# Patient Record
Sex: Female | Born: 2005 | Race: White | Hispanic: No | Marital: Single | Smoking: Never smoker
Health system: Southern US, Community
[De-identification: ages and names within clinical notes are randomized; demographics above are authoritative.]

## PROBLEM LIST (undated history)

## (undated) DIAGNOSIS — E271 Primary adrenocortical insufficiency: Secondary | ICD-10-CM

## (undated) DIAGNOSIS — L309 Dermatitis, unspecified: Secondary | ICD-10-CM

---
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
## 2023-01-19 ENCOUNTER — Ambulatory Visit: Payer: Medicaid Other | Admitting: Allergy

## 2023-01-19 ENCOUNTER — Ambulatory Visit (INDEPENDENT_AMBULATORY_CARE_PROVIDER_SITE_OTHER): Payer: Worker's Compensation | Admitting: Orthopaedic Surgery

## 2023-01-19 ENCOUNTER — Encounter: Payer: Self-pay | Admitting: Orthopaedic Surgery

## 2023-01-19 DIAGNOSIS — S83014A Lateral dislocation of right patella, initial encounter: Secondary | ICD-10-CM

## 2023-01-19 NOTE — Progress Notes (Signed)
Office Visit Note   Patient: Erin Blankenship           Date of Birth: [birth year]           MRN: [id number] Visit Date: 01/19/2023              Requested by: Casper Harrison, NP [address], [address] [place],  [place] [address] PCP: Francee Piccolo I, NP   Assessment & Plan: Visit Diagnoses:  1. Lateral dislocation of patella, right, initial encounter     Plan: At this point we will transition to a PSO brace.  She can go back to work as long as she is wearing the brace.  I will place a referral for physical therapy for strengthening.  Recheck in 4 weeks.  We were unable to get in touch with Worker's Comp. therefore we could not provide her with the brace today.  We will need to keep her out of work until she can wear the brace to work.  Total face to face encounter time was greater than 25 minutes and over half of this time was spent in counseling and/or coordination of care.  Follow-Up Instructions: Return in about 4 weeks (around 02/16/2023).   Orders:  No orders of the defined types were placed in this encounter.  No orders of the defined types were placed in this encounter.     Procedures: No procedures performed   Clinical Data: No additional findings.   Subjective: Chief Complaint  Patient presents with   Right Knee - Follow-up    Patella dislocation    HPI Erin Blankenship returns today for follow-up for right patellar dislocation.  Worker's Comp. injury.  She is doing much better.  Review of Systems  Constitutional: Negative.   HENT: Negative.    Eyes: Negative.   Respiratory: Negative.    Cardiovascular: Negative.   Endocrine: Negative.   Musculoskeletal: Negative.   Neurological: Negative.   Hematological: Negative.   Psychiatric/Behavioral: Negative.    All other systems reviewed and are negative.    Objective: Vital Signs: There were no vitals taken for this visit.  Physical Exam Vitals and nursing note reviewed.  Constitutional:      Appearance: She is  well-developed.  HENT:     Head: Normocephalic and atraumatic.  Pulmonary:     Effort: Pulmonary effort is normal.  Abdominal:     Palpations: Abdomen is soft.  Musculoskeletal:     Cervical back: Neck supple.  Skin:    General: Skin is warm.     Capillary Refill: Capillary refill takes less than 2 seconds.  Neurological:     Mental Status: She is alert and oriented to person, place, and time.  Psychiatric:        Behavior: Behavior normal.        Thought Content: Thought content normal.        Judgment: Judgment normal.    Ortho Exam Exam of the right knee shows no joint effusion.  Patella tracking is normal.  She has no tenderness.  She has some slight limitation with flexion of the knee. Specialty Comments:  No specialty comments available.  Imaging: No results found.   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 No pertinent surgical history. Social History   Occupational History   Not on file  Tobacco Use   Smoking status: Never   Smokeless tobacco: Not on file  Substance and Sexual Activity   Alcohol use: No   Drug use: No   Sexual activity: Not on file

## 2023-01-22 ENCOUNTER — Ambulatory Visit: Payer: Worker's Compensation

## 2023-01-22 NOTE — Progress Notes (Signed)
Patient was fit with a right small PSO brace.

## 2023-01-25 ENCOUNTER — Telehealth: Payer: Self-pay | Admitting: Orthopaedic Surgery

## 2023-01-25 NOTE — Telephone Encounter (Signed)
Patient called and said she is having increasing burning  pain in her left knee. CB#[phone]

## 2023-01-26 NOTE — Telephone Encounter (Signed)
Recommend ice and NSAID and rest.  Make sure the brace is fitting properly.  Thanks.

## 2023-01-26 NOTE — Telephone Encounter (Signed)
Tried to call. No answer. No voicemail.

## 2023-01-27 NOTE — Telephone Encounter (Signed)
It's likely from the tissues healing.  Hopefully she has started PT.

## 2023-01-27 NOTE — Telephone Encounter (Signed)
Called again. Patient states that she has a burning sensation with any weight bearing. She was fitted with 2 different PSO braces and was given the brace that fit her the best. She states that she has this pain with or without the brace. She has tried ice, Ibuprofen, and Tylenol.

## 2023-01-28 ENCOUNTER — Other Ambulatory Visit: Payer: Self-pay

## 2023-01-28 DIAGNOSIS — S83014A Lateral dislocation of right patella, initial encounter: Secondary | ICD-10-CM

## 2023-01-28 NOTE — Telephone Encounter (Signed)
yes

## 2023-01-28 NOTE — Telephone Encounter (Signed)
Tried to call. Call wouldn't go through.

## 2023-01-28 NOTE — Telephone Encounter (Signed)
Spoke with patient. She has been out of work due to knee pain. Can she get a note keeping her out? She has not started PT yet. Morrison Old has sent request for PT to her case manager.

## 2023-01-29 NOTE — Telephone Encounter (Signed)
Contacted patient and left note up front for pick up.

## 2023-03-10 ENCOUNTER — Ambulatory Visit (INDEPENDENT_AMBULATORY_CARE_PROVIDER_SITE_OTHER): Payer: Worker's Compensation | Admitting: Orthopaedic Surgery

## 2023-03-10 ENCOUNTER — Encounter: Payer: Self-pay | Admitting: Orthopaedic Surgery

## 2023-03-10 DIAGNOSIS — S83014A Lateral dislocation of right patella, initial encounter: Secondary | ICD-10-CM

## 2023-03-10 NOTE — Progress Notes (Signed)
Office Visit Note   Patient: Erin Blankenship           Date of Birth: 11/07/2005           MRN: [id number] Visit Date: 03/10/2023              Requested by: Casper Harrison, NP [address] [place],  [place] [address] PCP: Francee Piccolo I, NP   Assessment & Plan: Visit Diagnoses:  1. Lateral dislocation of patella, right, initial encounter     Plan: Impression is 10 weeks status post right knee patella dislocation.  Patient is clinically improving however she still has a fair amount of quad atrophy.  I urged her to push herself a little bit more with strengthening.  Continue to wean out of her PSO per PT.  Out of work for another 4 weeks.  Follow-up in 4 weeks for recheck.  Call with concerns or questions.  Total face to face encounter time was greater than 25 minutes and over half of this time was spent in counseling and/or coordination of care.  Follow-Up Instructions: Return in about 4 weeks (around 04/07/2023).   Orders:  No orders of the defined types were placed in this encounter.  No orders of the defined types were placed in this encounter.     Procedures: No procedures performed   Clinical Data: No additional findings.   Subjective: Chief Complaint  Patient presents with   Right Knee - Pain    HPI patient is a pleasant 17 year old female who is here today for follow-up of her right knee work comp injury.  She is approximately 10 weeks out from right knee patella dislocation.  She has been compliant wearing a PSO brace and has been in physical therapy.  She feels as though the pain has improved but she is still having some grinding to the lateral patella when she is out of the brace.  She also notes weakness when she is out of the brace.  She tells me that PT is trying to wean her out of her PSO brace to strengthen her quad.  She is nervous doing this as she had 1 instance last week when her knee gave way and she fell to the ground.    Review of Systems   Constitutional: Negative.   HENT: Negative.    Eyes: Negative.   Respiratory: Negative.    Cardiovascular: Negative.   Endocrine: Negative.   Musculoskeletal: Negative.   Neurological: Negative.   Hematological: Negative.   Psychiatric/Behavioral: Negative.    All other systems reviewed and are negative.    Objective: Vital Signs: There were no vitals taken for this visit.  Physical Exam Vitals and nursing note reviewed.  Constitutional:      Appearance: She is well-developed.  HENT:     Head: Normocephalic and atraumatic.  Pulmonary:     Effort: Pulmonary effort is normal.  Abdominal:     Palpations: Abdomen is soft.  Musculoskeletal:     Cervical back: Neck supple.  Skin:    General: Skin is warm.     Capillary Refill: Capillary refill takes less than 2 seconds.  Neurological:     Mental Status: She is alert and oriented to person, place, and time.  Psychiatric:        Behavior: Behavior normal.        Thought Content: Thought content normal.        Judgment: Judgment normal.     Ortho Exam  right knee exam: Range of motion 0 to 100 degrees.  She has tenderness to the lateral patella facet.  She is neurovascularly intact distally.  Specialty Comments:  No specialty comments available.  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 No pertinent surgical history. Social History   Occupational History   Not on file  Tobacco Use   Smoking  status: Never   Smokeless tobacco: Not on file  Substance and Sexual Activity   Alcohol use: No   Drug use: No   Sexual activity: Not on file

## 2023-04-09 ENCOUNTER — Ambulatory Visit: Payer: Managed Care, Other (non HMO) | Admitting: Orthopaedic Surgery

## 2023-04-29 ENCOUNTER — Ambulatory Visit: Payer: Worker's Compensation | Admitting: Orthopaedic Surgery

## 2023-04-29 DIAGNOSIS — S83014A Lateral dislocation of right patella, initial encounter: Secondary | ICD-10-CM

## 2023-04-29 NOTE — Progress Notes (Signed)
 Office Visit Note   Patient: Erin Blankenship           Date of Birth: May 27, 2005           MRN: [id number] Visit Date: 04/29/2023              Requested by: Brinda Mardy FERNS, NP [address], [address] [place],  [place] [address] PCP: Brinda Mardy I, NP   Assessment & Plan: Visit Diagnoses:  1. Lateral dislocation of patella, right, initial encounter     Plan: Arno is now 2 months status post right patellar dislocation.  She is pretty much back to baseline.  Her rating is 0%.  She is released to work without restrictions.  Follow-up as needed.  Follow-Up Instructions: No follow-ups on file.   Orders:  No orders of the defined types were placed in this encounter.  No orders of the defined types were placed in this encounter.     Procedures: No procedures performed   Clinical Data: No additional findings.   Subjective: Chief Complaint  Patient presents with   Right Knee - Pain    HPI [name] returns today to follow-up for right patellar dislocation.  She reports some achiness and discomfort in cold weather.  She is using athletic tape.  No other complaints. Review of Systems  Constitutional: Negative.   HENT: Negative.    Eyes: Negative.   Respiratory: Negative.    Cardiovascular: Negative.   Endocrine: Negative.   Musculoskeletal: Negative.   Neurological: Negative.   Hematological: Negative.   Psychiatric/Behavioral: Negative.    All other systems reviewed and are negative.    Objective: Vital Signs: There were no vitals taken for this visit.  Physical Exam Vitals and nursing note reviewed.  Constitutional:      Appearance: She is well-developed.  HENT:     Head: Normocephalic and atraumatic.  Pulmonary:     Effort: Pulmonary effort is normal.  Abdominal:     Palpations: Abdomen is soft.  Musculoskeletal:     Cervical back: Neck supple.  Skin:    General: Skin is warm.     Capillary Refill: Capillary refill takes less than 2 seconds.  Neurological:      Mental Status: She is alert and oriented to person, place, and time.  Psychiatric:        Behavior: Behavior normal.        Thought Content: Thought content normal.        Judgment: Judgment normal.     Ortho Exam Examination of the right knee shows full range of motion.  Normal patella tracking.  No joint effusion. Specialty Comments:  No specialty comments available.  Imaging: No results found.   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 Social History   Occupational History   Not on file  Tobacco Use   Smoking status: Never   Smokeless tobacco: Not on  file  Substance and Sexual Activity   Alcohol use: No   Drug use: No   Sexual activity: Not on file
